# Patient Record
Sex: Male | Born: 1975 | Race: White | Hispanic: No | Smoking: Never smoker
Health system: Southern US, Community
[De-identification: ages and names within clinical notes are randomized; demographics above are authoritative.]

---

## 2022-01-31 ENCOUNTER — Emergency Department (HOSPITAL_COMMUNITY): Payer: Self-pay

## 2022-01-31 ENCOUNTER — Emergency Department (HOSPITAL_COMMUNITY): Payer: PRIVATE HEALTH INSURANCE

## 2022-01-31 ENCOUNTER — Encounter (HOSPITAL_COMMUNITY): Payer: Self-pay

## 2022-01-31 ENCOUNTER — Emergency Department (HOSPITAL_COMMUNITY)
Admission: EM | Admit: 2022-01-31 | Discharge: 2022-01-31 | Disposition: A | Discharge status: Home / Self Care | Payer: PRIVATE HEALTH INSURANCE | Attending: Emergency Medicine | Admitting: Emergency Medicine

## 2022-01-31 DIAGNOSIS — Z23 Encounter for immunization: Secondary | ICD-10-CM | POA: Diagnosis not present

## 2022-01-31 DIAGNOSIS — S61012A Laceration without foreign body of left thumb without damage to nail, initial encounter: Secondary | ICD-10-CM | POA: Insufficient documentation

## 2022-01-31 DIAGNOSIS — S61011A Laceration without foreign body of right thumb without damage to nail, initial encounter: Secondary | ICD-10-CM

## 2022-01-31 DIAGNOSIS — M7989 Other specified soft tissue disorders: Secondary | ICD-10-CM | POA: Insufficient documentation

## 2022-01-31 DIAGNOSIS — W208XXA Other cause of strike by thrown, projected or falling object, initial encounter: Secondary | ICD-10-CM | POA: Diagnosis not present

## 2022-01-31 DIAGNOSIS — Y990 Civilian activity done for income or pay: Secondary | ICD-10-CM | POA: Insufficient documentation

## 2022-01-31 DIAGNOSIS — S6992XA Unspecified injury of left wrist, hand and finger(s), initial encounter: Secondary | ICD-10-CM | POA: Diagnosis present

## 2022-01-31 MED ORDER — BACITRACIN ZINC 500 UNIT/GM EX OINT
TOPICAL_OINTMENT | CUTANEOUS | Status: AC
  Filled 2022-01-31: qty 1.8

## 2022-01-31 MED ORDER — LIDOCAINE HCL (PF) 1 % IJ SOLN
10.0000 mL | Freq: Once | INTRAMUSCULAR | Status: AC
  Administered 2022-01-31: 10 mL
  Filled 2022-01-31: qty 30

## 2022-01-31 MED ORDER — CEFAZOLIN SODIUM-DEXTROSE 2-4 GM/100ML-% IV SOLN
2.0000 g | Freq: Once | INTRAVENOUS | Status: AC
  Administered 2022-01-31: 2 g via INTRAVENOUS
  Filled 2022-01-31: qty 100

## 2022-01-31 MED ORDER — HYDROMORPHONE HCL 1 MG/ML IJ SOLN
1.0000 mg | Freq: Once | INTRAMUSCULAR | Status: AC
  Administered 2022-01-31: 1 mg via INTRAVENOUS
  Filled 2022-01-31: qty 1

## 2022-01-31 MED ORDER — TETANUS-DIPHTH-ACELL PERTUSSIS 5-2.5-18.5 LF-MCG/0.5 IM SUSY
0.5000 mL | PREFILLED_SYRINGE | Freq: Once | INTRAMUSCULAR | Status: AC
  Administered 2022-01-31: 0.5 mL via INTRAMUSCULAR
  Filled 2022-01-31: qty 0.5

## 2022-01-31 NOTE — ED Notes (Signed)
Splint applied to thumb per PA request.

## 2022-01-31 NOTE — Discharge Instructions (Addendum)
You have been provided a dose of IV antibiotics in the emergency department today.  You may continue to manage the pain with your pain medication and anti-inflammatories, such as ibuprofen or naproxen.  Your Tdap booster has also been updated today, you have also been provided with finger splint for comfort and immobilization and a work note to excuse your absence until follow up with orthopedics.  Contact information for the hand specialist office of Earney Hamburg, PA-C has been provided for you.  Please go to their office first thing in the morning tomorrow for continued medical management and reevaluation.  Return to the ED for new or worsening symptoms as discussed.

## 2022-01-31 NOTE — ED Provider Notes (Signed)
[facility] DEPT Provider Note   CSN: [id number] Arrival date & time: 01/31/22  1223     History {Add pertinent medical, surgical, social history, OB history to HPI:1} Chief Complaint  Patient presents with   Laceration    Paul Fritz is a 46 y.o. male with chief complaint of right thumb pain.  Patient was at work when he dropped a large piece of metal onto his around 11 AM.  Attempted to control the bleeding he was driven by his boss to the emergency department.  Denies loss of sensation of the finger, reduced range of motion, or complete amputation.  Expresses concern for his finger and for the associated pain.  Currently takes 100 mg morphine twice a day, once in the morning and once at lunchtime.  This is provided by a pain management specialist for his chronic back pain.  He has not had his second dose of morphine today.  Denies lightheadedness, dizziness, shortness of breath, chest pain.  Unknown status of last tetanus.  The history is provided by the patient and medical records.  Laceration     Home Medications Prior to Admission medications   Medication Sig Start Date End Date Taking? Authorizing Provider  HYDROcodone-acetaminophen (NORCO) 7.5-325 MG tablet Take 1 tablet by mouth 4 (four) times daily. 01/29/22  Yes [provider]  morphine (MS CONTIN) 100 MG 12 hr tablet Take 100 mg by mouth 2 (two) times daily. 01/29/22  Yes [provider]      Allergies    Patient has no known allergies.    Review of Systems   Review of Systems  Musculoskeletal:        Right thumb injury    Physical Exam Updated Vital Signs BP (!) 138/94 (BP Location: Left Arm)   Pulse 87   Temp 98.1 F (36.7 C) (Oral)   Resp 18   SpO2 99%  Physical Exam Vitals and nursing note reviewed.  Constitutional:      General: He is not in acute distress.    Appearance: He is well-developed.  HENT:     Head: Normocephalic and atraumatic.   Eyes:     Conjunctiva/sclera: Conjunctivae normal.  Cardiovascular:     Rate and Rhythm: Normal rate and regular rhythm.     Pulses: Normal pulses.     Heart sounds: No murmur heard. Pulmonary:     Effort: Pulmonary effort is normal. No respiratory distress.     Breath sounds: Normal breath sounds.  Abdominal:     Palpations: Abdomen is soft.     Tenderness: There is no abdominal tenderness.  Musculoskeletal:        General: No swelling.       Hands:     Cervical back: Neck supple.  Skin:    General: Skin is warm and dry.     Capillary Refill: Capillary refill takes less than 2 seconds.  Neurological:     Mental Status: He is alert and oriented to person, place, and time.     Sensory: No sensory deficit.     Motor: No weakness.  Psychiatric:        Mood and Affect: Mood normal.    ED Results / Procedures / Treatments   Labs (all labs ordered are listed, but only abnormal results are displayed) Labs Reviewed - No data to display  EKG None  Radiology DG Finger Thumb Right  Result Date: 01/31/2022 CLINICAL DATA:  Laceration after dropping metal on hand. EXAM:  RIGHT THUMB 2+V COMPARISON:  No recent comparison. FINDINGS: Soft tissue defect along the volar aspect of the thumb. Associated mildly displaced fracture of the distal phalanx of the thumb. Complete transection of the distal aspect of the distal phalanx of the thumb. No joint involvement. No radiopaque foreign body with small bone fragments in the area. Comminution with fracture line extending through the distal fracture fragment. IMPRESSION: 1. Open fracture with near traumatic amputation of the distal aspect of the thumb with displaced comminuted tuft fracture associated with laceration. No radiopaque metallic foreign bodies. Electronically Signed   By: Zetta Bills M.D.   On: 01/31/2022 13:51    Procedures Procedures  {Document cardiac monitor, telemetry assessment procedure when appropriate:1}  Medications  Ordered in ED Medications  bacitracin 500 UNIT/GM ointment (has no administration in time range)  bacitracin 500 UNIT/GM ointment (has no administration in time range)  HYDROmorphone (DILAUDID) injection 1 mg (1 mg Intravenous Given 01/31/22 1543)  ceFAZolin (ANCEF) IVPB 2g/100 mL premix (2 g Intravenous New Bag/Given 01/31/22 1803)  Tdap (BOOSTRIX) injection 0.5 mL (0.5 mLs Intramuscular Given 01/31/22 1545)  lidocaine (PF) (XYLOCAINE) 1 % injection 10 mL (10 mLs Infiltration Given by Other 01/31/22 1555)    ED Course/ Medical Decision Making/ A&P                           Medical Decision Making Amount and/or Complexity of Data Reviewed External Data Reviewed: notes. Labs:  Decision-making details documented in ED Course. Radiology: ordered and independent interpretation performed. Decision-making details documented in ED Course. ECG/medicine tests:  Decision-making details documented in ED Course.  Risk OTC drugs. Prescription drug management.   46 y.o. male presents to the ED for concern of Laceration   This involves an extensive number of treatment options, and is a complaint that carries with it a high risk of complications and morbidity.  The emergent differential diagnosis prior to evaluation includes, but is not limited to: laceration, complete amputation, partial amputation, abrasion  This is not an exhaustive differential.   Past Medical History / Co-morbidities / Social History: Hx of chronic back pain per pt.  Works in a IT sales professional.    Additional History:  Internal and external records from outside source obtained and reviewed including orthopedics, family medicine, ED visits.  Physical Exam: Physical exam performed. The pertinent findings include: incomplete laceration of right thumb distal to the IP joint.  Hemorrhage clotted and is under control.         Lab Tests: None  Imaging Studies: I ordered imaging studies including x-ray of right  thumb .  I independently visualized and interpreted said imaging.  Pertinent results include: Open fracture with near traumatic amputation of the distal aspect of the thumb with displaced comminuted tuft fracture associated with laceration. No radiopaque metallic foreign bodies I agree with the radiologist interpretation.  Medications: I ordered medication including LET and lidocaine for topical anesthesia.  Reevaluation of the patient after these medicines showed that the patient tolerated this well and without complications.  I have reviewed the patients home medicines and have made adjustments as needed  Procedures: Patient underwent laceration repair, described above in further detail.  Reevaluation of patient after this intervention showed that the patient tolerated this well and without complications.  Finger laceration wrapped appropriately  **9 sutures**   ED Course/Disposition: Pt well-appearing on exam.  Sustained a crush open fracture of the right thumb around 11 AM today.  Physical exam reveals an open fracture of the right thumb.  X-ray imaging indicates open fracture with comminuted fragments in the thumb as well.  Sensation intact.  Movement/ROM intact.  CRT of the tip of the thumb and the wrist and thumb less than 2 seconds.  No foreign body visualized.  Requested consultation with hand specialist.  1615: I consulted with Hilbert Odor, PA-C hand specialist.  We discussed the patient's physical exam, imaging, history at great length.  We reviewed that patient has full sensation of all parts of the right thumb, CRT less than 2 seconds of the entire right thumb, and no significant discoloration of the partially amputated section.  Because of this, he recommended the cleaning and closing of the area with sutures with a dose of IV antibiotics such as Ancef in the ED, and then having the patient follow-up first thing tomorrow morning in the hand specialist's office for reevaluation.     Pressure irrigation performed. Wound explored and base of wound visualized in a bloodless field without evidence of foreign body.  Laceration occurred < 8 hours prior to repair which was well tolerated.  Tdap updated.  Pt has no comorbidities to effect normal wound healing.  Due to extent and mechanism of injury, as well as the open fracture status, pt was given antibiotics in the ED.  Discussed suture home care with patient and answered questions. Pt to follow-up with hand specialist for reevaluation and continued medical management first thing tomorrow morning in the office.  They are to return to the ED sooner for signs of infection or ischemia.  Pt is hemodynamically stable, in NAD, and in good condition at time of discharge.   After consideration of the diagnostic results and the patient's encounter today, I feel that the emergency department workup does not suggest an emergent condition requiring admission or immediate intervention beyond what has been performed at this time.  The patient is safe for discharge and has been instructed to return immediately for worsening symptoms, change in symptoms or any other concerns.  Discussed course of treatment thoroughly with the patient, whom demonstrated understanding.  Patient in agreement and has no further questions.  I discussed this case with my attending physician Dr. Tamera Punt, who agreed with the proposed treatment course and cosigned this note including patient's presenting symptoms, physical exam, and planned diagnostics and interventions.  Attending physician stated agreement with plan or made changes to plan which were implemented.     This chart was dictated using voice recognition software.  Despite best efforts to proofread, errors can occur which can change the documentation meaning.   {Document critical care time when appropriate:1} {Document review of labs and clinical decision tools ie heart score, Chads2Vasc2 etc:1}  {Document your  independent review of radiology images, and any outside records:1} {Document your discussion with family members, caretakers, and with consultants:1} {Document social determinants of health affecting pt's care:1} {Document your decision making why or why not admission, treatments were needed:1} Final Clinical Impression(s) / ED Diagnoses Final diagnoses:  Thumb laceration, right, initial encounter    Rx / DC Orders ED Discharge Orders     None

## 2022-01-31 NOTE — ED Triage Notes (Addendum)
Pt arrived via POV, states he dropped large piece of metal on hand. Lacerations to right hand. Last tetanus unknown.

## 2022-01-31 NOTE — ED Notes (Signed)
Patient requesting pain medication. PA aware.

## 2022-02-03 ENCOUNTER — Encounter (HOSPITAL_COMMUNITY): Admission: RE | Disposition: A | Discharge status: Home / Self Care | Payer: Self-pay | Attending: Orthopedic Surgery

## 2022-02-03 ENCOUNTER — Encounter (HOSPITAL_COMMUNITY): Payer: Self-pay | Admitting: Orthopedic Surgery

## 2022-02-03 ENCOUNTER — Other Ambulatory Visit: Payer: Self-pay

## 2022-02-03 ENCOUNTER — Ambulatory Visit (HOSPITAL_BASED_OUTPATIENT_CLINIC_OR_DEPARTMENT_OTHER): Payer: PRIVATE HEALTH INSURANCE | Admitting: Anesthesiology

## 2022-02-03 ENCOUNTER — Ambulatory Visit (HOSPITAL_COMMUNITY): Payer: PRIVATE HEALTH INSURANCE | Admitting: Anesthesiology

## 2022-02-03 ENCOUNTER — Ambulatory Visit (HOSPITAL_COMMUNITY): Payer: PRIVATE HEALTH INSURANCE

## 2022-02-03 ENCOUNTER — Encounter: Payer: Self-pay | Admitting: Orthopedic Surgery

## 2022-02-03 ENCOUNTER — Ambulatory Visit (HOSPITAL_COMMUNITY)
Admission: RE | Admit: 2022-02-03 | Discharge: 2022-02-03 | Disposition: A | Discharge status: Home / Self Care | Payer: PRIVATE HEALTH INSURANCE | Source: Other Acute Inpatient Hospital | Attending: Orthopedic Surgery | Admitting: Orthopedic Surgery

## 2022-02-03 ENCOUNTER — Ambulatory Visit (INDEPENDENT_AMBULATORY_CARE_PROVIDER_SITE_OTHER): Payer: Worker's Compensation | Admitting: Orthopedic Surgery

## 2022-02-03 DIAGNOSIS — S62521B Displaced fracture of distal phalanx of right thumb, initial encounter for open fracture: Secondary | ICD-10-CM

## 2022-02-03 DIAGNOSIS — S6991XA Unspecified injury of right wrist, hand and finger(s), initial encounter: Secondary | ICD-10-CM | POA: Diagnosis not present

## 2022-02-03 DIAGNOSIS — W228XXA Striking against or struck by other objects, initial encounter: Secondary | ICD-10-CM | POA: Insufficient documentation

## 2022-02-03 DIAGNOSIS — S61319A Laceration without foreign body of unspecified finger with damage to nail, initial encounter: Secondary | ICD-10-CM

## 2022-02-03 DIAGNOSIS — S61111A Laceration without foreign body of right thumb with damage to nail, initial encounter: Secondary | ICD-10-CM

## 2022-02-03 HISTORY — PX: NAILBED REPAIR: SHX5028

## 2022-02-03 HISTORY — PX: PERCUTANEOUS PINNING: SHX2209

## 2022-02-03 LAB — BASIC METABOLIC PANEL
Anion gap: 10 (ref 5–15)
BUN: 12 mg/dL (ref 6–20)
CO2: 25 mmol/L (ref 22–32)
Calcium: 9.3 mg/dL (ref 8.9–10.3)
Chloride: 104 mmol/L (ref 98–111)
Creatinine, Ser: 0.77 mg/dL (ref 0.61–1.24)
GFR, Estimated: >60 mL/min (ref >60)
Glucose, Bld: 78 mg/dL (ref 70–99)
Potassium: 3.5 mmol/L (ref 3.5–5.1)
Sodium: 139 mmol/L (ref 135–145)

## 2022-02-03 LAB — CBC
HCT: 38.1 % — ABNORMAL LOW (ref 39.0–52.0)
Hemoglobin: 12.5 g/dL — ABNORMAL LOW (ref 13.0–17.0)
MCH: 30.8 pg (ref 26.0–34.0)
MCHC: 32.8 g/dL (ref 30.0–36.0)
MCV: 93.8 fL (ref 80.0–100.0)
Platelets: 262 10*3/uL (ref 150–400)
RBC: 4.06 MIL/uL — ABNORMAL LOW (ref 4.22–5.81)
RDW: 12.7 % (ref 11.5–15.5)
WBC: 7.7 10*3/uL (ref 4.0–10.5)
nRBC: 0 % (ref 0.0–0.2)

## 2022-02-03 SURGERY — PINNING, EXTREMITY, PERCUTANEOUS
Anesthesia: General | Site: Thumb | Laterality: Right

## 2022-02-03 MED ORDER — PROPOFOL 10 MG/ML IV BOLUS
INTRAVENOUS | Status: AC
  Filled 2022-02-03: qty 20

## 2022-02-03 MED ORDER — HYDROMORPHONE HCL 1 MG/ML IJ SOLN
0.2500 mg | INTRAMUSCULAR | Status: DC | PRN
  Administered 2022-02-03: 0.5 mg via INTRAVENOUS

## 2022-02-03 MED ORDER — PROPOFOL 10 MG/ML IV BOLUS
INTRAVENOUS | Status: DC | PRN
Start: 2022-02-03 — End: 2022-02-03
  Administered 2022-02-03: 150 mg via INTRAVENOUS

## 2022-02-03 MED ORDER — DEXAMETHASONE SODIUM PHOSPHATE 10 MG/ML IJ SOLN
INTRAMUSCULAR | Status: DC | PRN
  Administered 2022-02-03: 5 mg via INTRAVENOUS

## 2022-02-03 MED ORDER — ACETAMINOPHEN 10 MG/ML IV SOLN
INTRAVENOUS | Status: DC | PRN
  Administered 2022-02-03: 1000 mg via INTRAVENOUS

## 2022-02-03 MED ORDER — ORAL CARE MOUTH RINSE: 15.0000 mL | Freq: Once | OROMUCOSAL | Status: AC

## 2022-02-03 MED ORDER — HYDROMORPHONE HCL 1 MG/ML IJ SOLN
INTRAMUSCULAR | Status: AC
  Filled 2022-02-03: qty 1

## 2022-02-03 MED ORDER — PHENYLEPHRINE HCL-NACL 20-0.9 MG/250ML-% IV SOLN
INTRAVENOUS | Status: DC | PRN
  Administered 2022-02-03: 40 ug/min via INTRAVENOUS

## 2022-02-03 MED ORDER — ROCURONIUM BROMIDE 10 MG/ML (PF) SYRINGE
PREFILLED_SYRINGE | INTRAVENOUS | Status: AC
  Filled 2022-02-03: qty 10

## 2022-02-03 MED ORDER — BACITRACIN ZINC 500 UNIT/GM EX OINT
TOPICAL_OINTMENT | CUTANEOUS | Status: AC
  Filled 2022-02-03: qty 28.35

## 2022-02-03 MED ORDER — ONDANSETRON HCL 4 MG/2ML IJ SOLN
INTRAMUSCULAR | Status: DC | PRN
  Administered 2022-02-03: 4 mg via INTRAVENOUS

## 2022-02-03 MED ORDER — FENTANYL CITRATE (PF) 250 MCG/5ML IJ SOLN
INTRAMUSCULAR | Status: DC | PRN
Start: 2022-02-03 — End: 2022-02-03
  Administered 2022-02-03 (×3): 50 ug via INTRAVENOUS

## 2022-02-03 MED ORDER — CEFAZOLIN SODIUM-DEXTROSE 2-4 GM/100ML-% IV SOLN
2.0000 g | INTRAVENOUS | Status: AC
  Administered 2022-02-03: 2 g via INTRAVENOUS
  Filled 2022-02-03: qty 100

## 2022-02-03 MED ORDER — ROCURONIUM BROMIDE 10 MG/ML (PF) SYRINGE
PREFILLED_SYRINGE | INTRAVENOUS | Status: DC | PRN
  Administered 2022-02-03: 40 mg via INTRAVENOUS

## 2022-02-03 MED ORDER — FENTANYL CITRATE (PF) 250 MCG/5ML IJ SOLN
INTRAMUSCULAR | Status: AC
  Filled 2022-02-03: qty 5

## 2022-02-03 MED ORDER — OXYCODONE HCL 5 MG PO TABS: 5.0000 mg | ORAL_TABLET | Freq: Four times a day (QID) | ORAL | 0 refills | Status: DC | PRN

## 2022-02-03 MED ORDER — ONDANSETRON HCL 4 MG/2ML IJ SOLN
INTRAMUSCULAR | Status: AC
  Filled 2022-02-03: qty 2

## 2022-02-03 MED ORDER — MIDAZOLAM HCL 2 MG/2ML IJ SOLN
INTRAMUSCULAR | Status: AC
  Filled 2022-02-03: qty 2

## 2022-02-03 MED ORDER — CHLORHEXIDINE GLUCONATE 0.12 % MT SOLN
15.0000 mL | Freq: Once | OROMUCOSAL | Status: AC
  Administered 2022-02-03: 15 mL via OROMUCOSAL
  Filled 2022-02-03: qty 15

## 2022-02-03 MED ORDER — BACITRACIN ZINC 500 UNIT/GM EX OINT
TOPICAL_OINTMENT | CUTANEOUS | Status: DC | PRN
Start: 2022-02-03 — End: 2022-02-03
  Administered 2022-02-03: 1 via TOPICAL

## 2022-02-03 MED ORDER — SUGAMMADEX SODIUM 200 MG/2ML IV SOLN
INTRAVENOUS | Status: DC | PRN
  Administered 2022-02-03: 200 mg via INTRAVENOUS

## 2022-02-03 MED ORDER — LIDOCAINE 2% (20 MG/ML) 5 ML SYRINGE
INTRAMUSCULAR | Status: DC | PRN
  Administered 2022-02-03: 60 mg via INTRAVENOUS

## 2022-02-03 MED ORDER — MIDAZOLAM HCL 2 MG/2ML IJ SOLN
INTRAMUSCULAR | Status: DC | PRN
  Administered 2022-02-03: 2 mg via INTRAVENOUS

## 2022-02-03 MED ORDER — PHENYLEPHRINE 80 MCG/ML (10ML) SYRINGE FOR IV PUSH (FOR BLOOD PRESSURE SUPPORT)
PREFILLED_SYRINGE | INTRAVENOUS | Status: DC | PRN
  Administered 2022-02-03 (×3): 80 ug via INTRAVENOUS
  Administered 2022-02-03: 160 ug via INTRAVENOUS
  Administered 2022-02-03: 80 ug via INTRAVENOUS

## 2022-02-03 MED ORDER — LACTATED RINGERS IV SOLN: INTRAVENOUS | Status: DC

## 2022-02-03 MED ORDER — DEXAMETHASONE SODIUM PHOSPHATE 10 MG/ML IJ SOLN
INTRAMUSCULAR | Status: AC
  Filled 2022-02-03: qty 1

## 2022-02-03 MED ORDER — ACETAMINOPHEN 10 MG/ML IV SOLN
INTRAVENOUS | Status: AC
  Filled 2022-02-03: qty 100

## 2022-02-03 MED ORDER — SODIUM CHLORIDE 0.9 % IV SOLN
INTRAVENOUS | Status: AC | PRN
  Administered 2022-02-03: 1000 mL

## 2022-02-03 MED ORDER — KETOROLAC TROMETHAMINE 30 MG/ML IJ SOLN
INTRAMUSCULAR | Status: DC | PRN
  Administered 2022-02-03: 30 mg via INTRAVENOUS

## 2022-02-03 MED ORDER — LIDOCAINE 2% (20 MG/ML) 5 ML SYRINGE
INTRAMUSCULAR | Status: AC
  Filled 2022-02-03: qty 5

## 2022-02-03 MED ORDER — KETOROLAC TROMETHAMINE 30 MG/ML IJ SOLN
INTRAMUSCULAR | Status: AC
  Filled 2022-02-03: qty 1

## 2022-02-03 SURGICAL SUPPLY — 43 items
BAG COUNTER SPONGE SURGICOUNT (BAG) ×2 IMPLANT
BLADE CLIPPER SURG (BLADE) IMPLANT
BNDG COHESIVE 1X5 TAN STRL LF (GAUZE/BANDAGES/DRESSINGS) ×1 IMPLANT
BNDG CONFORM 2 STRL LF (GAUZE/BANDAGES/DRESSINGS) ×1 IMPLANT
BNDG ELASTIC 2 VLCR STRL LF (GAUZE/BANDAGES/DRESSINGS) ×1 IMPLANT
BNDG ELASTIC 3X5.8 VLCR STR LF (GAUZE/BANDAGES/DRESSINGS) ×2 IMPLANT
BNDG ELASTIC 4X5.8 VLCR STR LF (GAUZE/BANDAGES/DRESSINGS) ×2 IMPLANT
BNDG ESMARK 4X9 LF (GAUZE/BANDAGES/DRESSINGS) ×2 IMPLANT
BNDG GAUZE ELAST 4 BULKY (GAUZE/BANDAGES/DRESSINGS) ×2 IMPLANT
CORD BIPOLAR FORCEPS 12FT (ELECTRODE) ×2 IMPLANT
COVER SURGICAL LIGHT HANDLE (MISCELLANEOUS) ×2 IMPLANT
CUFF TOURN SGL QUICK 18X4 (TOURNIQUET CUFF) ×2 IMPLANT
CUFF TOURN SGL QUICK 24 (TOURNIQUET CUFF)
CUFF TRNQT CYL 24X4X16.5-23 (TOURNIQUET CUFF) IMPLANT
DRAPE OEC MINIVIEW 54X84 (DRAPES) ×2 IMPLANT
DRSG EMULSION OIL 3X3 NADH (GAUZE/BANDAGES/DRESSINGS) ×1 IMPLANT
DRSG XEROFORM 1X8 (GAUZE/BANDAGES/DRESSINGS) ×1 IMPLANT
ELECT REM PT RETURN 9FT ADLT (ELECTROSURGICAL)
ELECTRODE REM PT RTRN 9FT ADLT (ELECTROSURGICAL) IMPLANT
GAUZE 4X4 16PLY ~~LOC~~+RFID DBL (SPONGE) ×2 IMPLANT
GAUZE SPONGE 4X4 12PLY STRL (GAUZE/BANDAGES/DRESSINGS) ×2 IMPLANT
GOWN STRL REUS W/ TWL LRG LVL3 (GOWN DISPOSABLE) ×2 IMPLANT
GOWN STRL REUS W/TWL LRG LVL3 (GOWN DISPOSABLE) ×2
K-WIRE .045X4 (WIRE) ×1 IMPLANT
KIT BASIN OR (CUSTOM PROCEDURE TRAY) ×2 IMPLANT
KIT TURNOVER KIT B (KITS) ×2 IMPLANT
NS IRRIG 1000ML POUR BTL (IV SOLUTION) ×2 IMPLANT
PACK ORTHO EXTREMITY (CUSTOM PROCEDURE TRAY) ×2 IMPLANT
PAD ARMBOARD 7.5X6 YLW CONV (MISCELLANEOUS) ×4 IMPLANT
PAD CAST 4YDX4 CTTN HI CHSV (CAST SUPPLIES) ×1 IMPLANT
PADDING CAST COTTON 4X4 STRL (CAST SUPPLIES) ×2
SLING ARM FOAM STRAP LRG (SOFTGOODS) ×1 IMPLANT
SUCTION FRAZIER HANDLE 10FR (MISCELLANEOUS) ×1
SUCTION TUBE FRAZIER 10FR DISP (MISCELLANEOUS) ×1 IMPLANT
SUT CHROMIC 6 0 G 1 (SUTURE) ×1 IMPLANT
SUT ETHILON 4 0 PS 2 18 (SUTURE) IMPLANT
SUT VICRYL RAPIDE 4/0 PS 2 (SUTURE) ×2 IMPLANT
TOURNI-COT LRG GREEN (MISCELLANEOUS) ×2 IMPLANT
TOURNIQUET ADLT LRG GRN (MISCELLANEOUS) IMPLANT
TOWEL GREEN STERILE (TOWEL DISPOSABLE) ×2 IMPLANT
TOWEL GREEN STERILE FF (TOWEL DISPOSABLE) ×2 IMPLANT
TUBE CONNECTING 12X1/4 (SUCTIONS) IMPLANT
WATER STERILE IRR 1000ML POUR (IV SOLUTION) ×2 IMPLANT

## 2022-02-03 NOTE — Anesthesia Postprocedure Evaluation (Signed)
Anesthesia Post Note  Patient: Paul Fritz  Procedure(s) Performed: RIGHT THUMB DISTAL PHALANX FRACTURE PERCUTANEOUS PINNING (Right: Thumb) RIGHT THUMB NAILBED REPAIR (Right: Thumb)     Patient location during evaluation: PACU Anesthesia Type: General Level of consciousness: awake and alert Pain management: pain level controlled Vital Signs Assessment: post-procedure vital signs reviewed and stable Respiratory status: spontaneous breathing, nonlabored ventilation and respiratory function stable Cardiovascular status: blood pressure returned to baseline and stable Postop Assessment: no apparent nausea or vomiting Anesthetic complications: no Comments: Pt had a ride home but no one to stay with him tonight. I told him that we could not let him go home alone after anesthesia. Pt signed out AMA.   No notable events documented.  Last Vitals:  Vitals:   02/03/22 2030 02/03/22 2045  BP: 125/85 128/80  Pulse: 96 98  Resp: 12 15  Temp:    SpO2: 96% 98%    Last Pain:  Vitals:   02/03/22 2045  PainSc: 6                  Nolen Lindamood,W. EDMOND

## 2022-02-03 NOTE — Interval H&P Note (Signed)
History and Physical Interval Note:  02/03/2022 6:03 PM  Paul Fritz  has presented today for surgery, with the diagnosis of right thumb nailbed laceration, distal phalanx fracture.  The various methods of treatment have been discussed with the patient and family. After consideration of risks, benefits and other options for treatment, the patient has consented to  Procedure(s): Butler (Right) RIGHT THUMB NAILBED REPAIR (Right) as a surgical intervention.  The patient's history has been reviewed, patient examined, no change in status, stable for surgery.  I have reviewed the patient's chart and labs.  Questions were answered to the patient's satisfaction.     Ky Rumple [name]

## 2022-02-03 NOTE — Anesthesia Procedure Notes (Signed)
Procedure Name: Intubation Date/Time: 02/03/2022 7:06 PM Performed by: Reece Agar, CRNA Pre-anesthesia Checklist: Patient identified, Emergency Drugs available, Suction available and Patient being monitored Patient Re-evaluated:Patient Re-evaluated prior to induction Oxygen Delivery Method: Circle System Utilized Preoxygenation: Pre-oxygenation with 100% oxygen Induction Type: IV induction Ventilation: Mask ventilation without difficulty and Oral airway inserted - appropriate to patient size Laryngoscope Size: Mac and 4 Grade View: Grade I Tube type: Oral Tube size: 7.5 mm Number of attempts: 1 Airway Equipment and Method: Stylet and Oral airway Placement Confirmation: ETT inserted through vocal cords under direct vision, positive ETCO2 and breath sounds checked- equal and bilateral Secured at: 22 cm Tube secured with: Tape Dental Injury: Teeth and Oropharynx as per pre-operative assessment

## 2022-02-03 NOTE — H&P (View-Only) (Signed)
Office Visit Note   Patient: Paul Fritz           Date of Birth: 14-Apr-1976           MRN: [id number] Visit Date: 02/03/2022              Requested by: No referring provider defined for this encounter. PCP: System, Provider Not In   Assessment & Plan: Visit Diagnoses:  1. Injury of nail bed of right thumb, initial encounter   2. Open displaced fracture of distal phalanx of right thumb, initial encounter     Plan: Patient has an open fracture of the tuft of the right thumb distal phalanx as well as a nailbed injury to this thumb.  He was supposed to see me on Tuesday for surgical treatment on Wednesday, however, the patient was referred to the wrong office.  We discussed the nature of his injury and the need for irrigation debridement with repair of the nailbed and possible fixation of the fracture.  We reviewed the risk of surgery which include but not limited to bleeding, infection, damage to neurovascular structures, nonunion, malunion, need for additional surgery.  The patient would like to proceed.  We will try to do this later on this evening.  Follow-Up Instructions: No follow-ups on file.   Orders:  No orders of the defined types were placed in this encounter.  No orders of the defined types were placed in this encounter.     Procedures: No procedures performed   Clinical Data: No additional findings.   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  On Monday he dropped a heavy object in the right thumb with a laceration through the nail plate and nailbed with a soft tissue laceration around the ulnar aspect of the thumb.  Laceration involves the ulnar half of the thumb.  He has normal sensation at the very tip and at the radial aspect but some decrease sensation distal to the laceration.  He was discharged  on oral Keflex.  He has been in a splint.  His pain is overall improved since the injury.  He denies pain elsewhere in the hand.   Review of Systems   Objective: Vital Signs: Ht 6' (1.829 m)   Wt 160 lb 12.8 oz (72.9 kg)   BMI 21.81 kg/m   Physical Exam Constitutional:      Appearance: Normal appearance.  Cardiovascular:     Rate and Rhythm: Normal rate.     Pulses: Normal pulses.  Pulmonary:     Effort: Pulmonary effort is normal.  Skin:    General: Skin is warm and dry.     Capillary Refill: Capillary refill takes less than 2 seconds.  Neurological:     Mental Status: He is alert.    Right Hand Exam   Tenderness  Right hand tenderness location: TTP at tip of thumb and around laceration.  Other  Erythema: absent Sensation: decreased Pulse: present  Comments:  Laceration through nail plate and nail bed across ulnar aspect of thumb to mid aspect of thumb pulp.  Wound reapproximated with prolene sutures. Intact capillary refill throughout thumb.  SILT in the radial half of the thumb tip.       Specialty Comments:  No specialty comments available.  Imaging: No results found.   PMFS History: Patient Active  Problem List   Diagnosis Date Noted   Injury of nail bed of right thumb 02/03/2022   Open displaced fracture of distal phalanx of right thumb 02/03/2022   History reviewed. No pertinent past medical history.  History reviewed. No pertinent family history.  History reviewed. No pertinent surgical history. Social History   Occupational History   Not on file  Tobacco Use   Smoking status: Not on file   Smokeless tobacco: Not on file  Substance and Sexual Activity   Alcohol use: Not on file   Drug use: Not on file   Sexual activity: Not on file

## 2022-02-03 NOTE — Progress Notes (Signed)
Patient was discharged from PACU with his friend, however, patient stated that he is not staying with his friend and he will be home alone. I explained to him the risks and consequences after having general anesthesia while home alone. He reported that he understands and he feels "fine." Dr. Tempie Donning and Dr. Ola Spurr made aware. Patient signed AMA form.

## 2022-02-03 NOTE — Op Note (Addendum)
   Date of Surgery: 02/03/2022  INDICATIONS: Patient is a 46 y.o.-year-old male with a right thumb laceration with nail bed injury and distal phalangeal tuft fracture after a large object landed on his thumb.  Risks, benefits, and alternatives to surgery were again discussed with the patient in the preoperative area. The patient wishes to proceed with surgery.  Informed consent was signed after our discussion.   PREOPERATIVE DIAGNOSIS:  Open right thumb distal phalanx fracture Right thumb nail bed laceration 3.   Right thumb tip laceration  POSTOPERATIVE DIAGNOSIS: Same.  PROCEDURE:  Irrigation and debridement of open fracture Closed reduction and percutaneous pinning of right thumb distal phalanx Right thumb nail bed repair 4.   Right thumb laceration repair  SURGEON: Audria Nine, M.D.  ASSIST:   ANESTHESIA:  general  IV FLUIDS AND URINE: See anesthesia.  ESTIMATED BLOOD LOSS: <5 mL.  IMPLANTS: 0.045" k-wire x 1  DRAINS: None  COMPLICATIONS: None  DESCRIPTION OF PROCEDURE: The patient was met in the preoperative holding area where the surgical site was marked and the consent form was verified.  The patient was then taken to the operating room and transferred to the operating table.  All bony prominences were well padded. General endotracheal anesthesia was induced.  The operative extremity was prepped and draped in the usual and sterile fashion.  A formal time-out was performed to confirm that this was the correct patient, surgery, side, and site.   Following timeout, the previous sutures were removed.  A finger tourniquet was applied.  The nail plate was removed atraumatically.  There was a transverse laceration to the nail plate and the nail bed distal to the proximal nail fold.  The laceration continued around the radial aspect of the thumb into the mid aspect of the pulp.  The wound was opened and debrided of all hematoma.  The fracture site was identified.  The fracture  site was thoroughly irrigated with copious sterile saline.  A small curette was used to debride any possible foreign material at the fracture site.  Following thorough irrigation and debridement, the mini C arm was brought in.  A 0.045 K wire was advanced in a retrograde fashion through the tuft fracture fragment into the remainder of the distal phalanx.  AP and lateral view showed appropriate pin placement and fracture reduction.  The K wire was then bent and cut.  At this point the nailbed repair was performed using a 6-0 chromic suture in a simple interrupted fashion.  The radial nail fold and the thumb pulp laceration was closed using a 4-0 Vicryl Rapide suture in a simple fashion.  At this point, a small piece of Adaptic was cut to the shape of the nail plate and was placed underneath the proximal nail fold.  The tourniquet was removed.  The thumb was warm and well-perfused with intact capillary refill.  The nailbed and lacerations were then dressed with bacitracin followed by Xeroform. The wound was then dressed with 4 x 4's, Kling wrap, and a dorsal AlumaFoam splint, and loose 1 inch Coban.  POSTOPERATIVE PLAN: He will be discharged home with appropriate pain medication and discharge instructions.  I will see him back in 7 to 10 days for his first postop visit.  Audria Nine, MD 8:06 PM

## 2022-02-03 NOTE — Progress Notes (Signed)
Office Visit Note   Patient: Paul Fritz           Date of Birth: 14-Apr-1976           MRN: [id number] Visit Date: 02/03/2022              Requested by: No referring provider defined for this encounter. PCP: System, Provider Not In   Assessment & Plan: Visit Diagnoses:  1. Injury of nail bed of right thumb, initial encounter   2. Open displaced fracture of distal phalanx of right thumb, initial encounter     Plan: Patient has an open fracture of the tuft of the right thumb distal phalanx as well as a nailbed injury to this thumb.  He was supposed to see me on Tuesday for surgical treatment on Wednesday, however, the patient was referred to the wrong office.  We discussed the nature of his injury and the need for irrigation debridement with repair of the nailbed and possible fixation of the fracture.  We reviewed the risk of surgery which include but not limited to bleeding, infection, damage to neurovascular structures, nonunion, malunion, need for additional surgery.  The patient would like to proceed.  We will try to do this later on this evening.  Follow-Up Instructions: No follow-ups on file.   Orders:  No orders of the defined types were placed in this encounter.  No orders of the defined types were placed in this encounter.     Procedures: No procedures performed   Clinical Data: No additional findings.   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  On Monday he dropped a heavy object in the right thumb with a laceration through the nail plate and nailbed with a soft tissue laceration around the ulnar aspect of the thumb.  Laceration involves the ulnar half of the thumb.  He has normal sensation at the very tip and at the radial aspect but some decrease sensation distal to the laceration.  He was discharged  on oral Keflex.  He has been in a splint.  His pain is overall improved since the injury.  He denies pain elsewhere in the hand.   Review of Systems   Objective: Vital Signs: Ht 6' (1.829 m)   Wt 160 lb 12.8 oz (72.9 kg)   BMI 21.81 kg/m   Physical Exam Constitutional:      Appearance: Normal appearance.  Cardiovascular:     Rate and Rhythm: Normal rate.     Pulses: Normal pulses.  Pulmonary:     Effort: Pulmonary effort is normal.  Skin:    General: Skin is warm and dry.     Capillary Refill: Capillary refill takes less than 2 seconds.  Neurological:     Mental Status: He is alert.    Right Hand Exam   Tenderness  Right hand tenderness location: TTP at tip of thumb and around laceration.  Other  Erythema: absent Sensation: decreased Pulse: present  Comments:  Laceration through nail plate and nail bed across ulnar aspect of thumb to mid aspect of thumb pulp.  Wound reapproximated with prolene sutures. Intact capillary refill throughout thumb.  SILT in the radial half of the thumb tip.       Specialty Comments:  No specialty comments available.  Imaging: No results found.   PMFS History: Patient Active  Problem List   Diagnosis Date Noted   Injury of nail bed of right thumb 02/03/2022   Open displaced fracture of distal phalanx of right thumb 02/03/2022   History reviewed. No pertinent past medical history.  History reviewed. No pertinent family history.  History reviewed. No pertinent surgical history. Social History   Occupational History   Not on file  Tobacco Use   Smoking status: Not on file   Smokeless tobacco: Not on file  Substance and Sexual Activity   Alcohol use: Not on file   Drug use: Not on file   Sexual activity: Not on file

## 2022-02-03 NOTE — OR Nursing (Signed)
Finger tourniquet :  On @ 1917  Off @ 1952

## 2022-02-03 NOTE — Anesthesia Preprocedure Evaluation (Addendum)
Anesthesia Evaluation  Patient identified by MRN, date of birth, ID band Patient awake    Reviewed: Allergy & Precautions, H&P , NPO status , Patient's Chart, lab work & pertinent test results  Airway Mallampati: II  TM Distance: >3 FB Neck ROM: Full    Dental no notable dental hx. (+) Edentulous Upper, Edentulous Lower, Dental Advisory Given   Pulmonary neg pulmonary ROS,    Pulmonary exam normal breath sounds clear to auscultation       Cardiovascular negative cardio ROS   Rhythm:Regular Rate:Normal     Neuro/Psych negative neurological ROS  negative psych ROS   GI/Hepatic negative GI ROS, Neg liver ROS,   Endo/Other  negative endocrine ROS  Renal/GU negative Renal ROS  negative genitourinary   Musculoskeletal   Abdominal   Peds  Hematology negative hematology ROS (+)   Anesthesia Other Findings   Reproductive/Obstetrics negative OB ROS                            Anesthesia Physical Anesthesia Plan  ASA: 1  Anesthesia Plan: General   Post-op Pain Management: Ofirmev IV (intra-op)* and Toradol IV (intra-op)*   Induction: Intravenous  PONV Risk Score and Plan: 3 and Ondansetron, Dexamethasone and Midazolam  Airway Management Planned: Oral ETT and LMA  Additional Equipment:   Intra-op Plan:   Post-operative Plan: Extubation in OR  Informed Consent: I have reviewed the patients History and Physical, chart, labs and discussed the procedure including the risks, benefits and alternatives for the proposed anesthesia with the patient or authorized representative who has indicated his/her understanding and acceptance.     Dental advisory given  Plan Discussed with: CRNA  Anesthesia Plan Comments:        Anesthesia Quick Evaluation

## 2022-02-03 NOTE — Transfer of Care (Signed)
Immediate Anesthesia Transfer of Care Note  Patient: Paul Fritz  Procedure(s) Performed: RIGHT THUMB DISTAL PHALANX FRACTURE PERCUTANEOUS PINNING (Right: Thumb) RIGHT THUMB NAILBED REPAIR (Right: Thumb)  Patient Location: PACU  Anesthesia Type:General  Level of Consciousness: awake and alert   Airway & Oxygen Therapy: Patient Spontanous Breathing and Patient connected to face mask oxygen  Post-op Assessment: Report given to RN and Post -op Vital signs reviewed and stable  Post vital signs: Reviewed and stable  Last Vitals:  Vitals Value Taken Time  BP 138/95 02/03/22 2015  Temp    Pulse 103 02/03/22 2016  Resp 19 02/03/22 2016  SpO2 99 % 02/03/22 2016  Vitals shown include unvalidated device data.  Last Pain:  Vitals:   02/03/22 1512  PainSc: 4          Complications: No notable events documented.

## 2022-02-03 NOTE — Discharge Instructions (Signed)
Audria Nine, M.D. Hand Surgery  POST-OPERATIVE DISCHARGE INSTRUCTIONS   PRESCRIPTIONS: You may have been given a prescription to be taken as directed for post-operative pain control.  You may also take over the counter ibuprofen/aleve and tylenol for pain. Take this as directed on the packaging. Do not exceed 3000 mg tylenol/acetaminophen in 24 hours.  Ibuprofen 600-800 mg (3-4) tablets by mouth every 6 hours as needed for pain.  OR Aleve 2 tablets by mouth every 12 hours (twice daily) as needed for pain.  AND/OR Tylenol 1000 mg (2 tablets) every 8 hours as needed for pain.  Please use your pain medication carefully, as refills are limited and you may not be provided with one.  As stated above, please use over the counter pain medicine - it will also be helpful with decreasing your swelling.    ANESTHESIA: After your surgery, post-surgical discomfort or pain is likely. This discomfort can last several days to a few weeks. At certain times of the day your discomfort may be more intense.   Did you receive a nerve block?  A nerve block can provide pain relief for one hour to two days after your surgery. As long as the nerve block is working, you will experience little or no sensation in the area the surgeon operated on.  As the nerve block wears off, you will begin to experience pain or discomfort. It is very important that you begin taking your prescribed pain medication before the nerve block fully wears off. Treating your pain at the first sign of the block wearing off will ensure your pain is better controlled and more tolerable when full-sensation returns. Do not wait until the pain is intolerable, as the medicine will be less effective. It is better to treat pain in advance than to try and catch up.   General Anesthesia:  If you did not receive a nerve block during your surgery, you will need to start taking your pain medication shortly after your surgery and should continue  to do so as prescribed by your surgeon.     ICE AND ELEVATION: You may use ice for the first 48-72 hours, but it is not critical.   Elevation, as much as possible for the next 48 hours, is critical for decreasing swelling as well as for pain relief. Elevation means when you are seated or lying down, you hand should be at or above your heart. When walking, the hand needs to be at or above the level of your elbow.  If the bandage gets too tight, it may need to be loosened. Please contact our office and we will instruct you in how to do this.    SURGICAL BANDAGES:  Keep your dressing and/or splint clean and dry at all times.  Do not remove until you are seen again in the office.  If careful, you may place a plastic bag over your bandage and tape the end to shower, but be careful, do not get your bandages wet.     HAND THERAPY:  You may not need any. If you do, we will begin this at your follow up visit in the clinic.    ACTIVITY AND WORK: You are encouraged to move any fingers which are not in the bandage.  Light use of the fingers is allowed to assist the other hand with daily hygiene and eating, but strong gripping or lifting is often uncomfortable and should be avoided.  You might miss a variable period of time  from work and hopefully this issue has been discussed prior to surgery. You may not do any heavy work with your affected hand for about 2 weeks.    [facility] [address] [place],  [place]  [address] [phone]

## 2022-02-04 ENCOUNTER — Encounter: Payer: Self-pay | Admitting: Orthopedic Surgery

## 2022-02-04 ENCOUNTER — Encounter (HOSPITAL_COMMUNITY): Payer: Self-pay | Admitting: Orthopedic Surgery

## 2022-02-08 ENCOUNTER — Telehealth: Payer: Self-pay | Admitting: Orthopedic Surgery

## 2022-02-08 NOTE — Telephone Encounter (Signed)
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 Is he allowed to change the dressings if they get too dirty or should he wait until his appt next week. Please advise.

## 2022-02-09 ENCOUNTER — Telehealth: Payer: Self-pay

## 2022-02-09 NOTE — Telephone Encounter (Signed)
Patient called Triage line. I advised him per Dr. Tempie Donning   "He can change the outer bandage if it gets dirty. "

## 2022-02-09 NOTE — Telephone Encounter (Signed)
Marisue Ivan notified patient

## 2022-02-10 ENCOUNTER — Telehealth: Payer: Self-pay | Admitting: Orthopedic Surgery

## 2022-02-10 ENCOUNTER — Encounter: Payer: Self-pay | Admitting: Orthopedic Surgery

## 2022-02-10 NOTE — Telephone Encounter (Signed)
Pt called requesting a call back from Dr Frazier Butt or [name] for medical advice. Pt states he went back to work after surgery and is suffering. Please call pt at [phone].

## 2022-02-14 ENCOUNTER — Ambulatory Visit (INDEPENDENT_AMBULATORY_CARE_PROVIDER_SITE_OTHER): Payer: Worker's Compensation | Admitting: Orthopedic Surgery

## 2022-02-14 DIAGNOSIS — S6991XA Unspecified injury of right wrist, hand and finger(s), initial encounter: Secondary | ICD-10-CM

## 2022-02-14 DIAGNOSIS — S62521B Displaced fracture of distal phalanx of right thumb, initial encounter for open fracture: Secondary | ICD-10-CM

## 2022-02-14 NOTE — Progress Notes (Signed)
   Post-Op Visit Note   Patient: Roscoe Witts           Date of Birth: [birth year]           MRN: [id number] Visit Date: 02/14/2022 PCP: System, Provider Not In   Assessment & Plan:  Chief Complaint:  Chief Complaint  Patient presents with   Right Thumb - Routine Post Op   Visit Diagnoses:  1. Open displaced fracture of distal phalanx of right thumb, initial encounter   2. Injury of nail bed of right thumb, initial encounter     Plan: Patient is 10 days out from I&D of open right thumb distal phalanx fracture with CRPP and nail bed repair.  He is doing well postoperatively.  We reviewed pin care instructions.  He will do daily dressing changes at home for the nail bed injury.  I can see him back in another two weeks.   Follow-Up Instructions: No follow-ups on file.   Orders:  No orders of the defined types were placed in this encounter.  No orders of the defined types were placed in this encounter.   Imaging: No results found.  PMFS History: Patient Active Problem List   Diagnosis Date Noted   Injury of nail bed of right thumb 02/03/2022   Open displaced fracture of distal phalanx of right thumb 02/03/2022   Laceration of nail bed of finger    No past medical history on file.  No family history on file.  Past Surgical History:  Procedure Laterality Date   NAILBED REPAIR Right 02/03/2022   Procedure: RIGHT THUMB NAILBED REPAIR;  Surgeon: Marlyne Beards, MD;  Location: [facility] OR;  Service: Orthopedics;  Laterality: Right;   PERCUTANEOUS PINNING Right 02/03/2022   Procedure: RIGHT THUMB DISTAL PHALANX FRACTURE PERCUTANEOUS PINNING;  Surgeon: Marlyne Beards, MD;  Location: [facility] OR;  Service: Orthopedics;  Laterality: Right;   Social History   Occupational History   Not on file  Tobacco Use   Smoking status: Never   Smokeless tobacco: Never  Vaping Use   Vaping Use: Some days  Substance and Sexual Activity   Alcohol use: Not on file   Drug use: Not on file    Sexual activity: Not on file

## 2022-02-16 ENCOUNTER — Telehealth: Payer: Self-pay | Admitting: Orthopedic Surgery

## 2022-02-16 NOTE — Telephone Encounter (Signed)
Patient called. Says the pin has turned. Would like to know what to do? His call back number is [phone]

## 2022-02-17 ENCOUNTER — Encounter: Payer: Self-pay | Admitting: Radiology

## 2022-02-17 NOTE — Telephone Encounter (Signed)
Per Dr. Frazier Butt this is ok,  pt asked to be taken out of work again due to hitting his postop hand. Wrote note to be out of work till 03/12/22 and faxed to Vern Claude @ [phone]

## 2022-03-01 ENCOUNTER — Ambulatory Visit (INDEPENDENT_AMBULATORY_CARE_PROVIDER_SITE_OTHER): Payer: PRIVATE HEALTH INSURANCE | Admitting: Orthopedic Surgery

## 2022-03-01 ENCOUNTER — Ambulatory Visit: Payer: Self-pay

## 2022-03-01 DIAGNOSIS — S62521B Displaced fracture of distal phalanx of right thumb, initial encounter for open fracture: Secondary | ICD-10-CM

## 2022-03-01 NOTE — Progress Notes (Signed)
   Post-Op Visit Note   Patient: Paul Fritz           Date of Birth: 01-Jul-1976           MRN: [id number] Visit Date: 03/01/2022 PCP: System, Provider Not In   Assessment & Plan:  Chief Complaint:  Chief Complaint  Patient presents with   Right Thumb - Routine Post Op   Visit Diagnoses:  1. Open displaced fracture of distal phalanx of right thumb, initial encounter     Plan: Patient is approximately 3 and half weeks out from surgery.  X-ray today shows some slight gapping at the fracture site with backing out of the K wire.  The incision is clean and dry.  The nailbed repair looks healed.  There is a dusky appearing area at the radial border of the thumb pulp.  I will see him back next week at which point we may remove the k-wire.   Follow-Up Instructions: No follow-ups on file.   Orders:  Orders Placed This Encounter  Procedures   XR Finger Thumb Right   No orders of the defined types were placed in this encounter.   Imaging: No results found.  PMFS History: Patient Active Problem List   Diagnosis Date Noted   Injury of nail bed of right thumb 02/03/2022   Open displaced fracture of distal phalanx of right thumb 02/03/2022   Laceration of nail bed of finger    No past medical history on file.  No family history on file.  Past Surgical History:  Procedure Laterality Date   NAILBED REPAIR Right 02/03/2022   Procedure: RIGHT THUMB NAILBED REPAIR;  Surgeon: Marlyne Beards, MD;  Location: [facility] OR;  Service: Orthopedics;  Laterality: Right;   PERCUTANEOUS PINNING Right 02/03/2022   Procedure: RIGHT THUMB DISTAL PHALANX FRACTURE PERCUTANEOUS PINNING;  Surgeon: Marlyne Beards, MD;  Location: [facility] OR;  Service: Orthopedics;  Laterality: Right;   Social History   Occupational History   Not on file  Tobacco Use   Smoking status: Never   Smokeless tobacco: Never  Vaping Use   Vaping Use: Some days  Substance and Sexual Activity   Alcohol use: Not on file    Drug use: Not on file   Sexual activity: Not on file

## 2022-03-08 ENCOUNTER — Ambulatory Visit (INDEPENDENT_AMBULATORY_CARE_PROVIDER_SITE_OTHER): Payer: Worker's Compensation | Admitting: Orthopedic Surgery

## 2022-03-08 ENCOUNTER — Ambulatory Visit: Payer: Self-pay

## 2022-03-08 DIAGNOSIS — S62521B Displaced fracture of distal phalanx of right thumb, initial encounter for open fracture: Secondary | ICD-10-CM

## 2022-03-22 ENCOUNTER — Ambulatory Visit (INDEPENDENT_AMBULATORY_CARE_PROVIDER_SITE_OTHER): Payer: Worker's Compensation | Admitting: Orthopedic Surgery

## 2022-03-22 ENCOUNTER — Ambulatory Visit (INDEPENDENT_AMBULATORY_CARE_PROVIDER_SITE_OTHER): Payer: Worker's Compensation

## 2022-03-22 DIAGNOSIS — S62521B Displaced fracture of distal phalanx of right thumb, initial encounter for open fracture: Secondary | ICD-10-CM | POA: Diagnosis not present

## 2022-03-22 NOTE — Progress Notes (Signed)
   Post-Op Visit Note   Patient: Paul Fritz           Date of Birth: 1976/01/26           MRN: [id number] Visit Date: 03/22/2022 PCP: System, Provider Not In   Assessment & Plan:  Chief Complaint:  Chief Complaint  Patient presents with   Right Thumb - Follow-up, Fracture   Visit Diagnoses:  1. Open displaced fracture of distal phalanx of right thumb, initial encounter     Plan: Patient is 6.5 weeks s/p percutaneous pinning of an open right thumb distal phalanx fracture with associated nail bed injury repair.  His nail bed laceration has healed and the new nail has started to grow.  His lacerations are all well healed.  X-rays today show interval healing of the distal phalanx fracture.  He still has bothersome sensitivity at the tip of the thumb that should continue to improve.  I can see him back in another month.   Follow-Up Instructions: No follow-ups on file.   Orders:  Orders Placed This Encounter  Procedures   XR Finger Thumb Right   No orders of the defined types were placed in this encounter.   Imaging: No results found.  PMFS History: Patient Active Problem List   Diagnosis Date Noted   Injury of nail bed of right thumb 02/03/2022   Open displaced fracture of distal phalanx of right thumb 02/03/2022   Laceration of nail bed of finger    No past medical history on file.  No family history on file.  Past Surgical History:  Procedure Laterality Date   NAILBED REPAIR Right 02/03/2022   Procedure: RIGHT THUMB NAILBED REPAIR;  Surgeon: Marlyne Beards, MD;  Location: [facility];  Service: Orthopedics;  Laterality: Right;   PERCUTANEOUS PINNING Right 02/03/2022   Procedure: RIGHT THUMB DISTAL PHALANX FRACTURE PERCUTANEOUS PINNING;  Surgeon: Marlyne Beards, MD;  Location: [facility] OR;  Service: Orthopedics;  Laterality: Right;   Social History   Occupational History   Not on file  Tobacco Use   Smoking status: Never   Smokeless tobacco: Never  Vaping Use    Vaping Use: Some days  Substance and Sexual Activity   Alcohol use: Not on file   Drug use: Not on file   Sexual activity: Not on file

## 2022-03-23 ENCOUNTER — Telehealth: Payer: Self-pay | Admitting: Orthopedic Surgery

## 2022-03-23 NOTE — Telephone Encounter (Signed)
Pt called requesting a referral for pt. Pt phone number is [phone].

## 2022-03-25 ENCOUNTER — Other Ambulatory Visit: Payer: Self-pay | Admitting: Orthopedic Surgery

## 2022-03-25 DIAGNOSIS — S6991XA Unspecified injury of right wrist, hand and finger(s), initial encounter: Secondary | ICD-10-CM

## 2022-03-25 DIAGNOSIS — S62521B Displaced fracture of distal phalanx of right thumb, initial encounter for open fracture: Secondary | ICD-10-CM

## 2022-03-29 NOTE — Telephone Encounter (Signed)
Patient is needing clarification on where his physical therapy is going to be and why is it going to be at another location. Please advise

## 2022-03-29 NOTE — Telephone Encounter (Signed)
I called and advised, that due to our Occ. Therapist is out of the office for 3 weeks and the person that was going to be filling in is out as well, so we are referring out to other locations.

## 2022-04-12 ENCOUNTER — Ambulatory Visit (INDEPENDENT_AMBULATORY_CARE_PROVIDER_SITE_OTHER): Payer: Worker's Compensation

## 2022-04-12 ENCOUNTER — Telehealth: Payer: Self-pay | Admitting: Orthopedic Surgery

## 2022-04-12 ENCOUNTER — Ambulatory Visit (INDEPENDENT_AMBULATORY_CARE_PROVIDER_SITE_OTHER): Payer: Worker's Compensation | Admitting: Orthopedic Surgery

## 2022-04-12 DIAGNOSIS — S62521B Displaced fracture of distal phalanx of right thumb, initial encounter for open fracture: Secondary | ICD-10-CM

## 2022-04-12 NOTE — Telephone Encounter (Signed)
Patient had an appointment today with Dr. Frazier Butt but called back stating that his employer is needing an impairment rating for him to get his full benefits for WC. CB # [phone]

## 2022-04-12 NOTE — Telephone Encounter (Signed)
Pt called informing Dr Frazier Butt workers comp is going to fax some forms to him to fill out. Pt phone number is [phone]

## 2022-04-12 NOTE — Progress Notes (Signed)
   Post-Op Visit Note   Patient: Paul Fritz           Date of Birth: 01-25-1976           MRN: [id number] Visit Date: 04/12/2022 PCP: System, Provider Not In   Assessment & Plan:  Chief Complaint:  Chief Complaint  Patient presents with   Right Thumb - Fracture, Follow-up   Visit Diagnoses:  1. Open displaced fracture of distal phalanx of right thumb, initial encounter     Plan: Patient is now ~8.5 weeks s/p percutaneous pinning of open right thumb distal phalanx fracture with nail bed repair.  X-rays show a partially healed tuft fracture.  He continues to describe sensitivity in the thumb tip that is overall improving.  His nail plate has regrown but is no adherent to the underlying nail bed.  We discussed keeping the nail trimmed to avoid it catching.  We discussed hand therapy to work on tip desensitization but patient would rather work on this on his own.  He can see me again as needed at this point.   Follow-Up Instructions: No follow-ups on file.   Orders:  Orders Placed This Encounter  Procedures   XR Finger Thumb Right   No orders of the defined types were placed in this encounter.   Imaging: No results found.  PMFS History: Patient Active Problem List   Diagnosis Date Noted   Injury of nail bed of right thumb 02/03/2022   Open displaced fracture of distal phalanx of right thumb 02/03/2022   Laceration of nail bed of finger    No past medical history on file.  No family history on file.  Past Surgical History:  Procedure Laterality Date   NAILBED REPAIR Right 02/03/2022   Procedure: RIGHT THUMB NAILBED REPAIR;  Surgeon: Marlyne Beards, MD;  Location: [facility];  Service: Orthopedics;  Laterality: Right;   PERCUTANEOUS PINNING Right 02/03/2022   Procedure: RIGHT THUMB DISTAL PHALANX FRACTURE PERCUTANEOUS PINNING;  Surgeon: Marlyne Beards, MD;  Location: [facility];  Service: Orthopedics;  Laterality: Right;   Social History   Occupational History   Not  on file  Tobacco Use   Smoking status: Never   Smokeless tobacco: Never  Vaping Use   Vaping Use: Some days  Substance and Sexual Activity   Alcohol use: Not on file   Drug use: Not on file   Sexual activity: Not on file

## 2022-04-18 ENCOUNTER — Ambulatory Visit: Payer: Self-pay | Attending: Occupational Therapy | Admitting: Occupational Therapy

## 2022-04-29 ENCOUNTER — Ambulatory Visit (INDEPENDENT_AMBULATORY_CARE_PROVIDER_SITE_OTHER): Payer: Worker's Compensation | Admitting: Orthopedic Surgery

## 2022-04-29 ENCOUNTER — Encounter: Payer: Self-pay | Admitting: Orthopedic Surgery

## 2022-04-29 DIAGNOSIS — S62521B Displaced fracture of distal phalanx of right thumb, initial encounter for open fracture: Secondary | ICD-10-CM

## 2022-04-29 DIAGNOSIS — S6991XA Unspecified injury of right wrist, hand and finger(s), initial encounter: Secondary | ICD-10-CM

## 2022-04-29 NOTE — Progress Notes (Signed)
   Post-Op Visit Note   Patient: Paul Fritz           Date of Birth: 03/19/1976           MRN: [id number] Visit Date: 04/29/2022 PCP: System, Provider Not In   Assessment & Plan:  Chief Complaint:  Chief Complaint  Patient presents with   Right Hand - Follow-up    S/p pinning of distal thumb fracture 02/03/2022   Visit Diagnoses:  1. Injury of nail bed of right thumb, initial encounter   2. Open displaced fracture of distal phalanx of right thumb, initial encounter     Plan: Patient is now 12 weeks s/p percutaneous pinning of an open right thumb distal phalanx fracture with complex nail bed repair.  His new nail plate seems to be growing in.  He still has sensitivity at the thumb tip and difficulty with use of the thumb.  He has slightly less flexion at the IP joint than the contralateral side.  We again discussed the utility of hand therapy with the patient not interested.  He can see me again as needed.   Follow-Up Instructions: No follow-ups on file.   Orders:  No orders of the defined types were placed in this encounter.  No orders of the defined types were placed in this encounter.   Imaging: No results found.  PMFS History: Patient Active Problem List   Diagnosis Date Noted   Injury of nail bed of right thumb 02/03/2022   Open displaced fracture of distal phalanx of right thumb 02/03/2022   Laceration of nail bed of finger    History reviewed. No pertinent past medical history.  No family history on file.  Past Surgical History:  Procedure Laterality Date   NAILBED REPAIR Right 02/03/2022   Procedure: RIGHT THUMB NAILBED REPAIR;  Surgeon: Marlyne Beards, MD;  Location: [facility] OR;  Service: Orthopedics;  Laterality: Right;   PERCUTANEOUS PINNING Right 02/03/2022   Procedure: RIGHT THUMB DISTAL PHALANX FRACTURE PERCUTANEOUS PINNING;  Surgeon: Marlyne Beards, MD;  Location: [facility] OR;  Service: Orthopedics;  Laterality: Right;   Social History   Occupational  History   Not on file  Tobacco Use   Smoking status: Never   Smokeless tobacco: Never  Vaping Use   Vaping Use: Some days  Substance and Sexual Activity   Alcohol use: Not on file   Drug use: Not on file   Sexual activity: Not on file

## 2022-05-06 ENCOUNTER — Telehealth: Payer: Self-pay | Admitting: Orthopedic Surgery

## 2022-05-06 NOTE — Telephone Encounter (Signed)
I spoke with patient. 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 Patient is currently working full time with no restrictions and states that you have him at 50% impairment. He requests note be completed to state this as he is trying to get this case completed.  Please advise. OK for note? What would you like for it to say?

## 2022-05-06 NOTE — Telephone Encounter (Signed)
Pt called to set an appt for right thumb but would like a call back from [facility] for medical advice. Please call pt at [phone]

## 2022-05-09 NOTE — Telephone Encounter (Signed)
This is the patient that I discussed with you on Friday. Can you help me with the wording of the letter for patient?

## 2022-05-09 NOTE — Telephone Encounter (Signed)
[name] spoke with Dr. Frazier Butt this morning and entered notes. She has put them on Paul Fritz's desk to send to [employer] researcher.

## 2022-05-10 ENCOUNTER — Ambulatory Visit (INDEPENDENT_AMBULATORY_CARE_PROVIDER_SITE_OTHER): Payer: Worker's Compensation | Admitting: Orthopedic Surgery

## 2022-05-10 DIAGNOSIS — S62521B Displaced fracture of distal phalanx of right thumb, initial encounter for open fracture: Secondary | ICD-10-CM | POA: Diagnosis not present

## 2022-05-10 NOTE — Progress Notes (Signed)
Pt presents today for a new letter for work restrictions.  Pt does not want any restrictions.  He does have impairment of his thumb function with tip sensitivity and limited IP flexion secondary to his injury. He may return to work without restrictions.  If something changes and he is unable to do his daily activities, he can return to or call the office.  Marlyne Beards, M.D. [facility]

## 2022-10-03 ENCOUNTER — Ambulatory Visit: Payer: 59 | Admitting: Podiatry

## 2022-10-03 DIAGNOSIS — M79675 Pain in left toe(s): Secondary | ICD-10-CM | POA: Diagnosis not present

## 2022-10-03 DIAGNOSIS — B351 Tinea unguium: Secondary | ICD-10-CM

## 2022-10-03 MED ORDER — TERBINAFINE HCL 250 MG PO TABS: 250.0000 mg | ORAL_TABLET | Freq: Every day | ORAL | 0 refills | Status: AC

## 2022-10-03 NOTE — Progress Notes (Signed)
   Chief Complaint  Patient presents with   Ingrown Toenail    Left hallux ingrown and nail fungus,      Subjective: 47 y.o. male presenting today as a new patient for evaluation of thickening with discoloration and lateral growth of the left hallux nail plate.  Patient states it is very painful and tender.  He has tried multiple topical antifungal treatment modalities without any relief or improvement of his nails or symptoms.  Presenting today for further treatment and evaluation  No past medical history on file.  Past Surgical History:  Procedure Laterality Date   NAILBED REPAIR Right 02/03/2022   Procedure: RIGHT THUMB NAILBED REPAIR;  Surgeon: Sherilyn Cooter, MD;  Location: [facility];  Service: Orthopedics;  Laterality: Right;   PERCUTANEOUS PINNING Right 02/03/2022   Procedure: RIGHT THUMB DISTAL PHALANX FRACTURE PERCUTANEOUS PINNING;  Surgeon: Sherilyn Cooter, MD;  Location: [facility];  Service: Orthopedics;  Laterality: Right;    No Known Allergies    Objective: Physical Exam General: The patient is alert and oriented x3 in no acute distress.  Dermatology: Hyperkeratotic, discolored, thickened, onychodystrophy noted left hallux nail plate. Skin is warm, dry and supple bilateral lower extremities. Negative for open lesions or macerations.  Vascular: Palpable pedal pulses bilaterally. No edema or erythema noted. Capillary refill within normal limits.  Neurological: Epicritic and protective threshold grossly intact bilaterally.   Musculoskeletal Exam: No pedal deformity noted  Assessment: #1 Onychomycosis of toenail left hallux nail plate  Plan of Care:  #1 Patient was evaluated. #2  Today we discussed different treatment options including oral, topical, and laser antifungal treatment modalities.  We discussed their efficacies and side effects.  Patient opts for oral antifungal treatment modality #3 prescription for Lamisil 250 mg #90 daily. Pt denies a history of liver  pathology or symptoms.  Patient is otherwise healthy #4  Light debridement of the left hallux nail plate was also performed today to debride away the symptomatic border to the medial aspect of the nail plate.  Patient did feel some relief.  If he does not get significant relief we may need to proceed with a partial nail matricectomy to the medial border of the left hallux.  Patient will make a return appointment if needed  #5 return to clinic 6 months   Edrick Kins, DPM [facility]  Dr. Edrick Kins, DPM    [address], [place] [address]                Office [phone]  Fax [phone]

## 2023-03-06 ENCOUNTER — Telehealth: Payer: Self-pay | Admitting: Podiatry

## 2023-03-06 NOTE — Telephone Encounter (Signed)
LVM for patient to call back to reschedule.

## 2023-03-27 ENCOUNTER — Telehealth: Payer: Self-pay | Admitting: Podiatry

## 2023-03-27 NOTE — Telephone Encounter (Signed)
Left message for pt to call to r/s appt for 7.22 at 845am as the provider is out of the office.The appt has been cxled.

## 2023-04-03 ENCOUNTER — Ambulatory Visit: Payer: 59 | Admitting: Podiatry

## 2024-02-17 IMAGING — CR DG FINGER THUMB 2+V*R*
3 series · 3 of 3 positions shown · non-contrast
Comparison: No recent comparison.

CLINICAL DATA: Laceration after dropping metal on hand.

EXAM:
RIGHT THUMB 2+V

[x finger obl right]
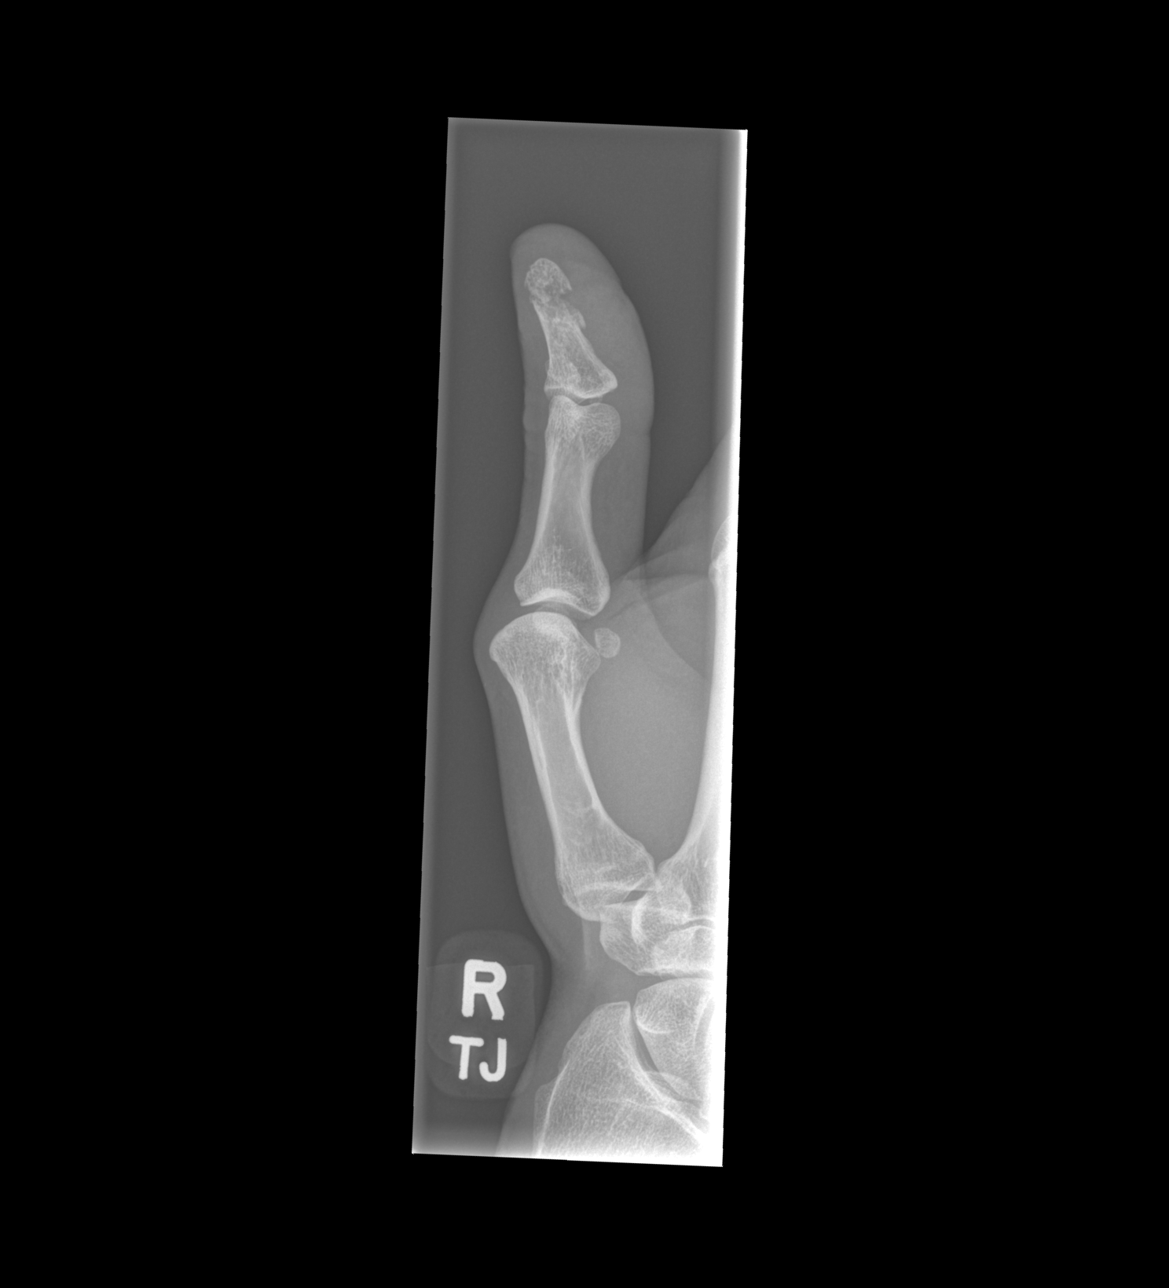

[x finger lat right]
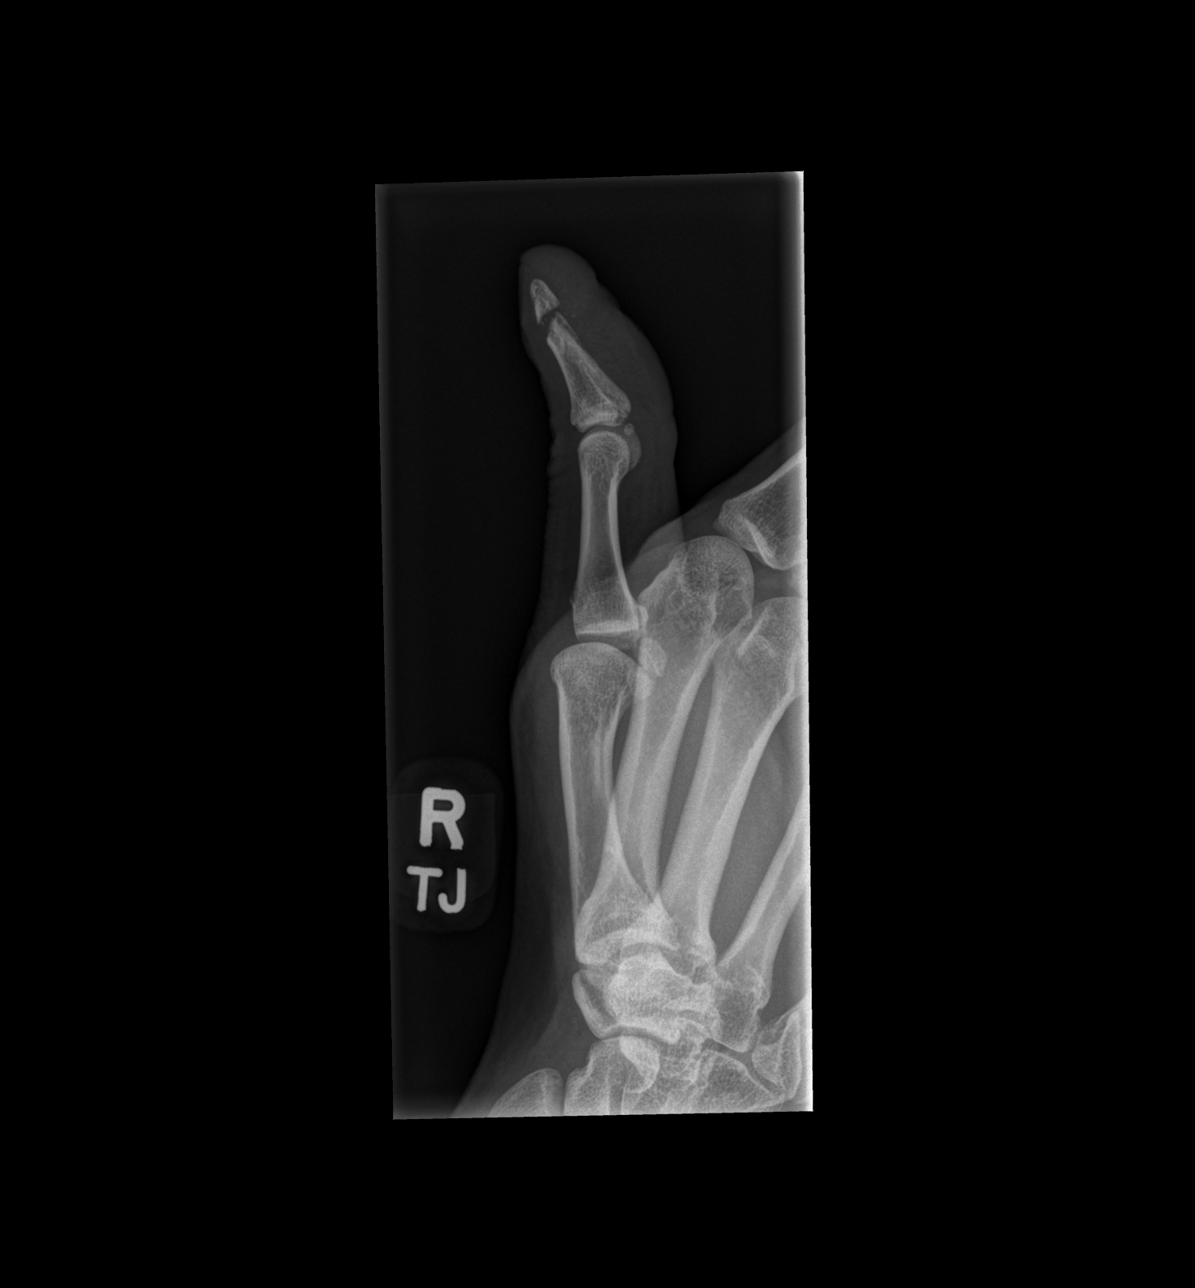

[x finger pa right]
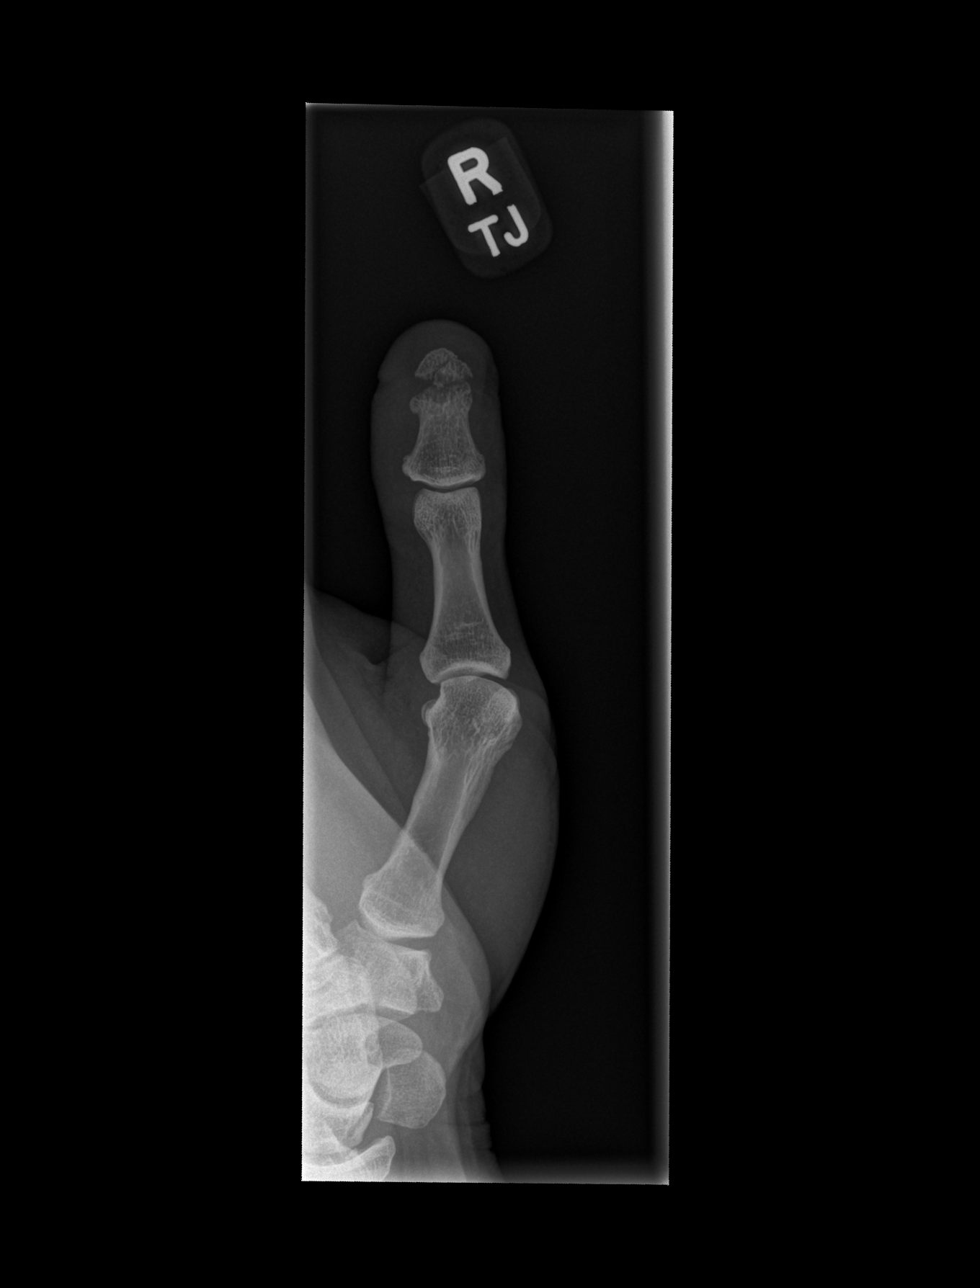

[3 of 3 positions shown; findings below may reference images not displayed]

FINDINGS: Soft tissue defect along the volar aspect of the thumb. Associated
mildly displaced fracture of the distal phalanx of the thumb.
Complete transection of the distal aspect of the distal phalanx of
the thumb. No joint involvement. No radiopaque foreign body with
small bone fragments in the area. Comminution with fracture line
extending through the distal fracture fragment.
IMPRESSION: 1. Open fracture with near traumatic amputation of the distal aspect
of the thumb with displaced comminuted tuft fracture associated with
laceration. No radiopaque metallic foreign bodies.
